# Patient Record
Sex: Male | Born: 1970 | Race: White | Hispanic: No | Marital: Married | Smoking: Never smoker
Health system: Southern US, Community
[De-identification: ages and names within clinical notes are randomized; demographics above are authoritative.]

## PROBLEM LIST (undated history)

## (undated) DIAGNOSIS — R0681 Apnea, not elsewhere classified: Secondary | ICD-10-CM

## (undated) DIAGNOSIS — E7800 Pure hypercholesterolemia, unspecified: Secondary | ICD-10-CM

---

## 2008-01-06 ENCOUNTER — Ambulatory Visit: Payer: Self-pay | Admitting: Internal Medicine

## 2008-01-18 ENCOUNTER — Emergency Department: Payer: Self-pay | Admitting: Emergency Medicine

## 2008-07-06 ENCOUNTER — Ambulatory Visit: Payer: Self-pay | Admitting: Internal Medicine

## 2008-07-23 ENCOUNTER — Ambulatory Visit: Payer: Self-pay | Admitting: Internal Medicine

## 2009-01-05 ENCOUNTER — Ambulatory Visit: Payer: Self-pay | Admitting: Internal Medicine

## 2011-10-08 ENCOUNTER — Ambulatory Visit: Payer: Self-pay | Admitting: Internal Medicine

## 2011-10-12 ENCOUNTER — Ambulatory Visit: Payer: Self-pay | Admitting: Internal Medicine

## 2011-10-17 ENCOUNTER — Ambulatory Visit: Payer: Self-pay | Admitting: Internal Medicine

## 2011-10-28 ENCOUNTER — Emergency Department: Payer: Self-pay | Admitting: Emergency Medicine

## 2012-01-05 ENCOUNTER — Emergency Department: Payer: Self-pay | Admitting: Emergency Medicine

## 2012-01-16 ENCOUNTER — Other Ambulatory Visit: Payer: Self-pay | Admitting: Internal Medicine

## 2012-01-16 LAB — LIPID PANEL
HDL Cholesterol: 42 mg/dL (ref 40–60)
VLDL Cholesterol, Calc: 42 mg/dL — ABNORMAL HIGH (ref 5–40)

## 2012-06-29 ENCOUNTER — Emergency Department: Payer: Self-pay | Admitting: Unknown Physician Specialty

## 2012-06-29 LAB — BASIC METABOLIC PANEL WITH GFR
Anion Gap: 6 — ABNORMAL LOW
BUN: 15 mg/dL
Calcium, Total: 9 mg/dL
Chloride: 105 mmol/L
Co2: 27 mmol/L
Creatinine: 1.05 mg/dL
EGFR (African American): >60
EGFR (Non-African Amer.): >60
Glucose: 109 mg/dL — ABNORMAL HIGH
Osmolality: 277
Potassium: 3.9 mmol/L
Sodium: 138 mmol/L

## 2012-06-29 LAB — HEPATIC FUNCTION PANEL A (ARMC)
Albumin: 3.9 g/dL
Alkaline Phosphatase: 61 U/L
Bilirubin, Direct: 0.1 mg/dL
Bilirubin,Total: 0.4 mg/dL
SGOT(AST): 24 U/L
SGPT (ALT): 35 U/L
Total Protein: 7.6 g/dL

## 2012-06-29 LAB — CBC
MCH: 27.9 pg (ref 26.0–34.0)
MCV: 83 fL (ref 80–100)
Platelet: 199 10*3/uL (ref 150–440)
WBC: 9.7 10*3/uL (ref 3.8–10.6)

## 2012-06-29 LAB — PRO B NATRIURETIC PEPTIDE: B-Type Natriuretic Peptide: 104 pg/mL

## 2012-06-29 LAB — TROPONIN I: Troponin-I: <0.02 ng/mL

## 2013-04-17 ENCOUNTER — Ambulatory Visit: Payer: Self-pay | Admitting: Gastroenterology

## 2013-12-23 IMAGING — CR DG CHEST 2V
1 series · 4 of 4 positions shown · non-contrast
Comparison: none

REASON FOR EXAM: cough, wheeze, fever
COMMENTS:

[Series 1: pa · 0.17mm/px · 4 of 4 slices shown]
[im 1/4]
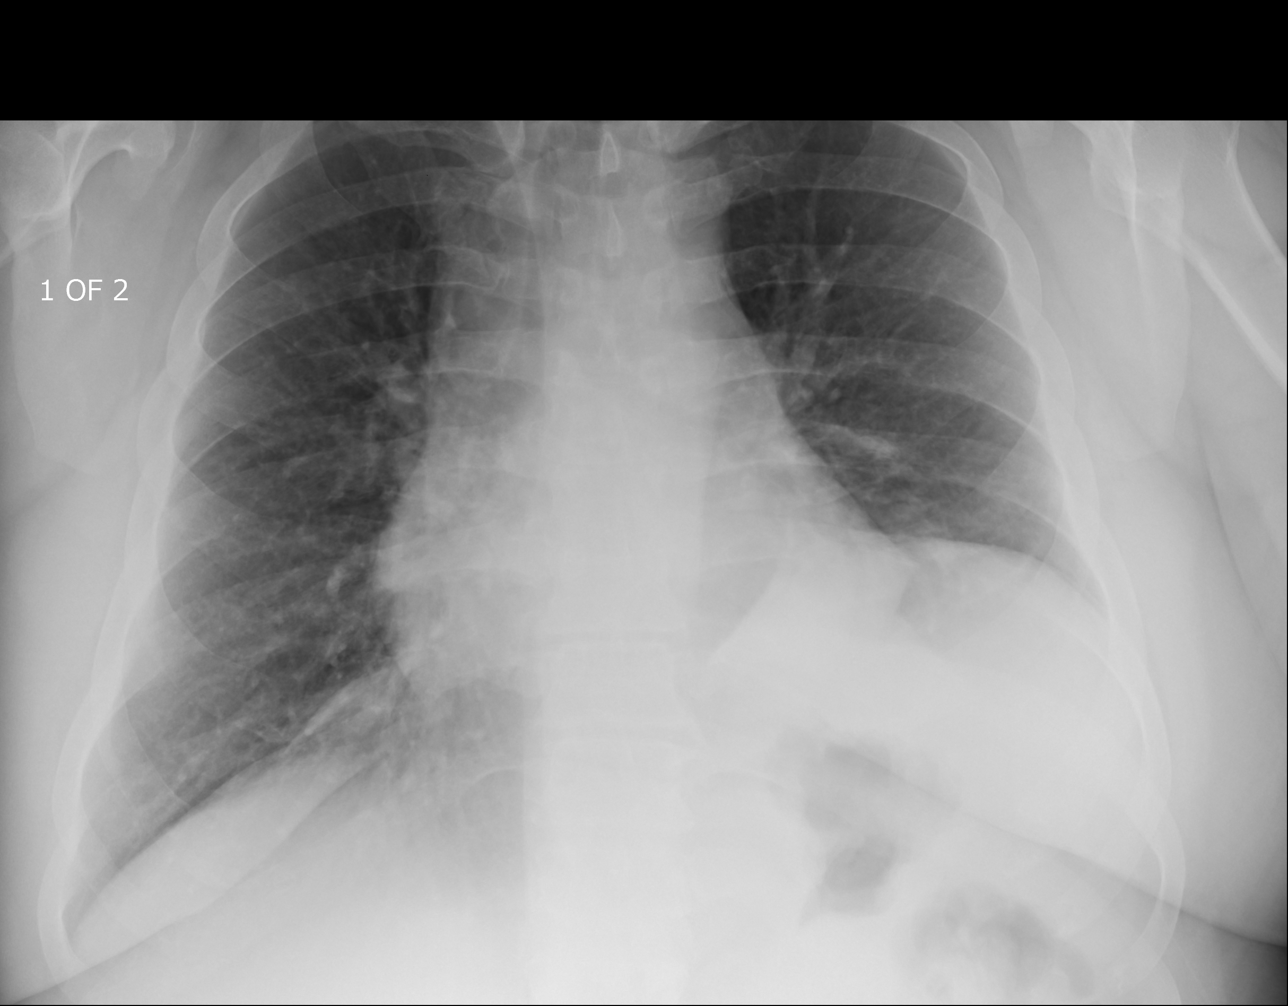
[im 2/4]
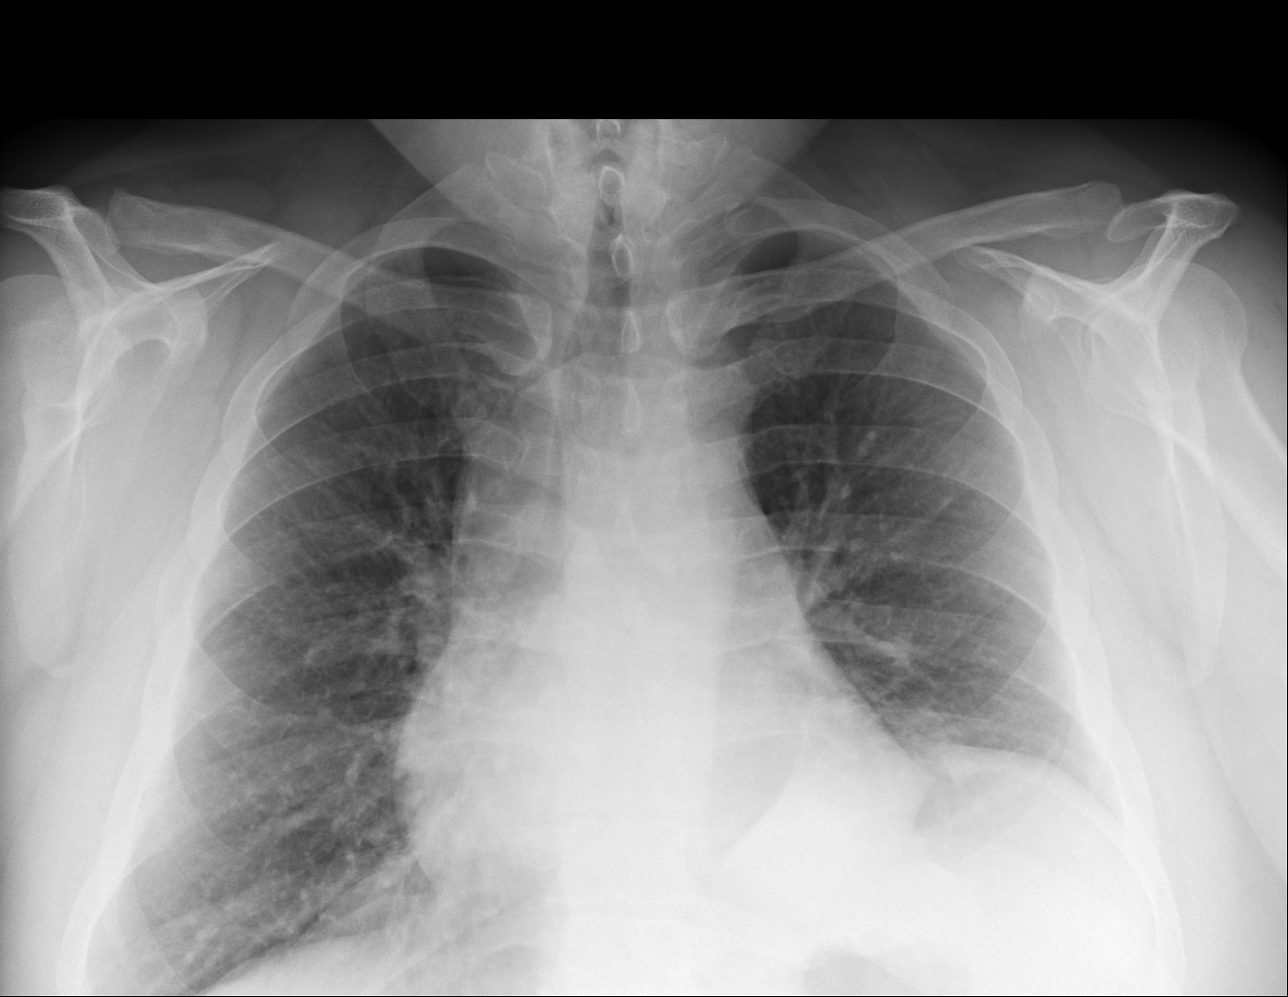
[im 3/4]
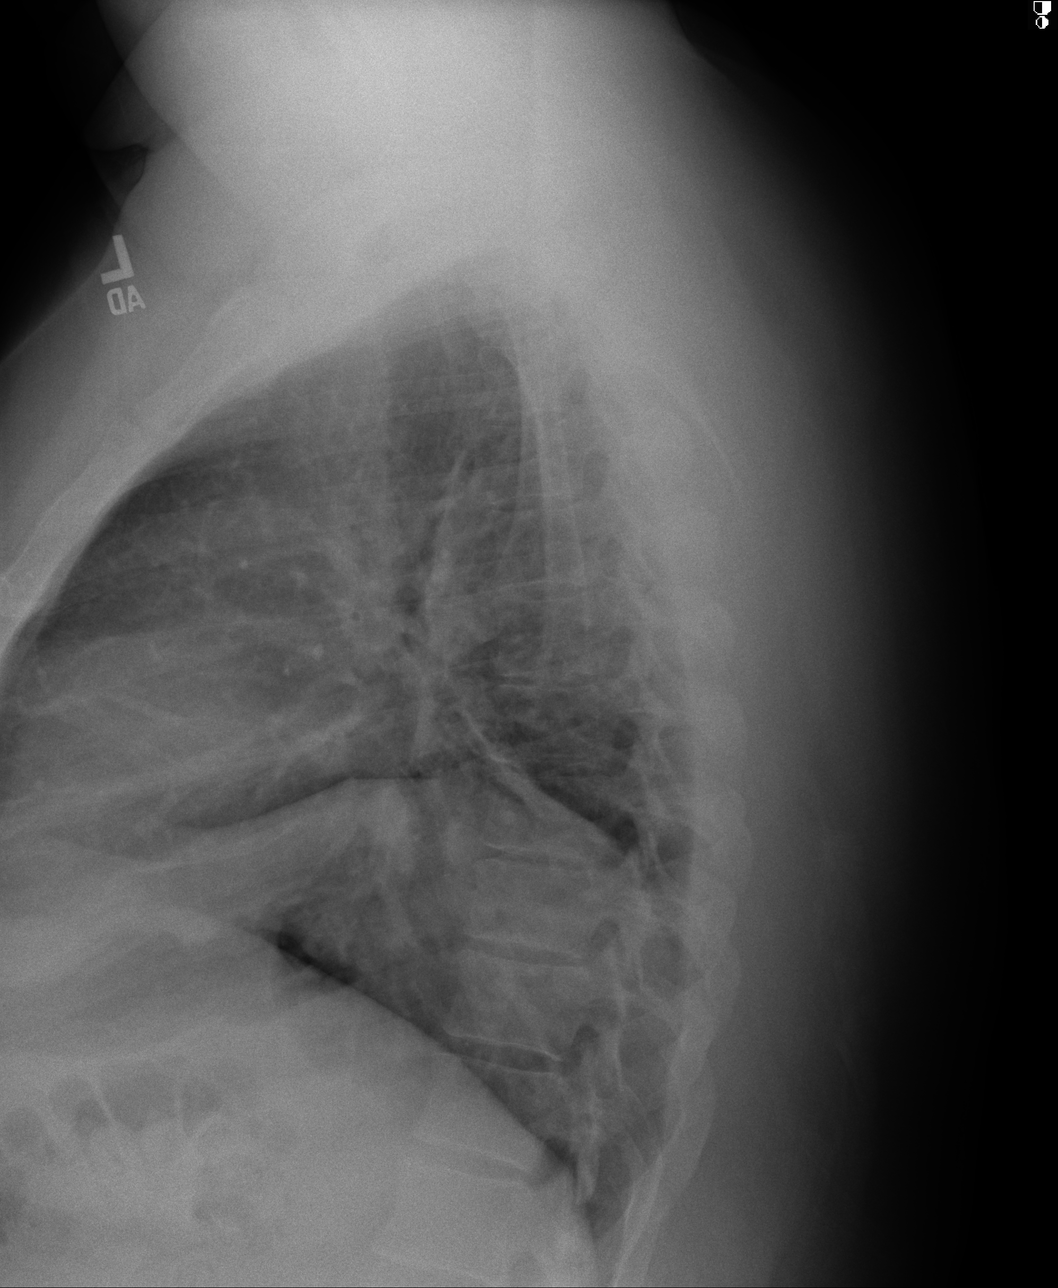
[im 4/4]
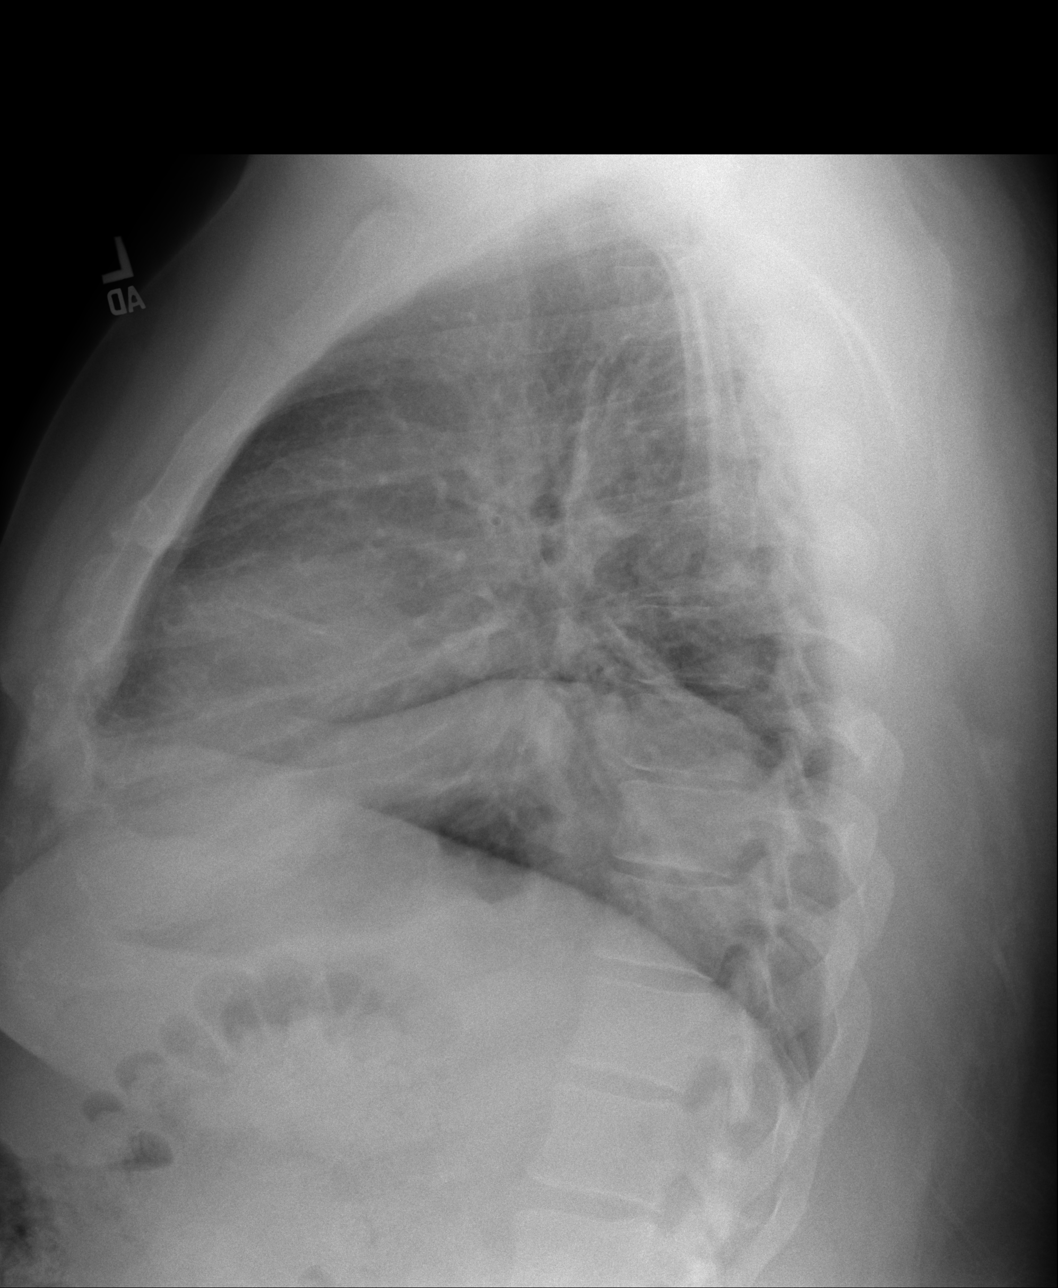

[4 of 4 positions shown; findings below may reference images not displayed]

PROCEDURE:     DXR - DXR CHEST PA (OR AP) AND LATERAL  - October 28, 2011 [DATE]

RESULT:     Comparison is made to a prior study dated 10/08/2011.

There is elevation of the left hemidiaphragm. Prominence of interstitial
markings is appreciated which is mild. No focal regions of consolidation are
identified. The visualized bony skeleton is unremarkable.
IMPRESSION: Elevated left hemidiaphragm without evidence of acute cardiopulmonary
disease.

## 2014-03-02 IMAGING — US US EXTREM LOW VENOUS*L*
1 series · 14 of 24 positions shown · non-contrast
Comparison: none

REASON FOR EXAM: Pain and increase edema left lower leg
COMMENTS:

[Series 1: us extrem low venous*left* · 0.12mm/px · 14 of 24 slices shown]
[im 1/24]
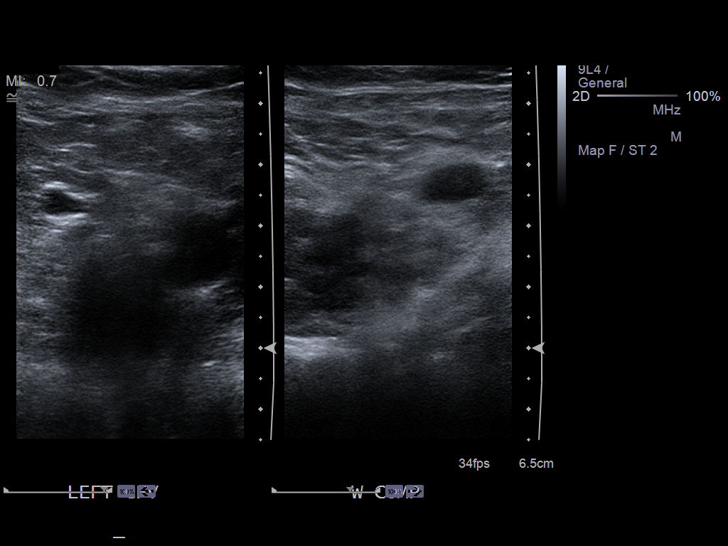
[im 3/24]
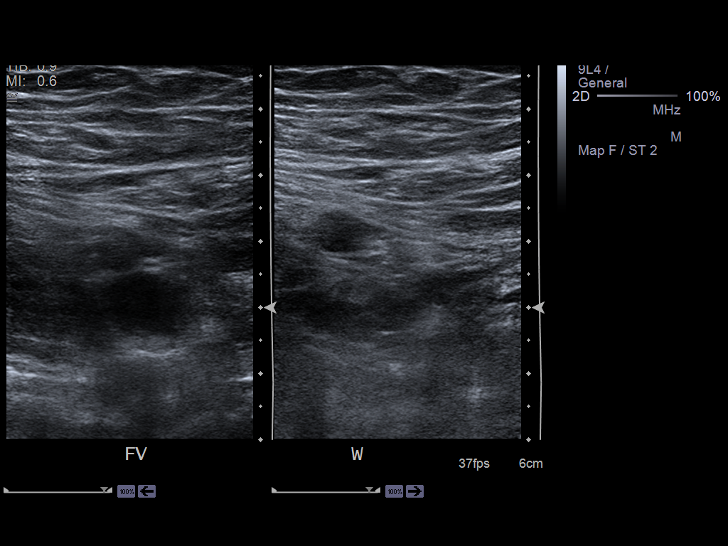
[im 5/24]
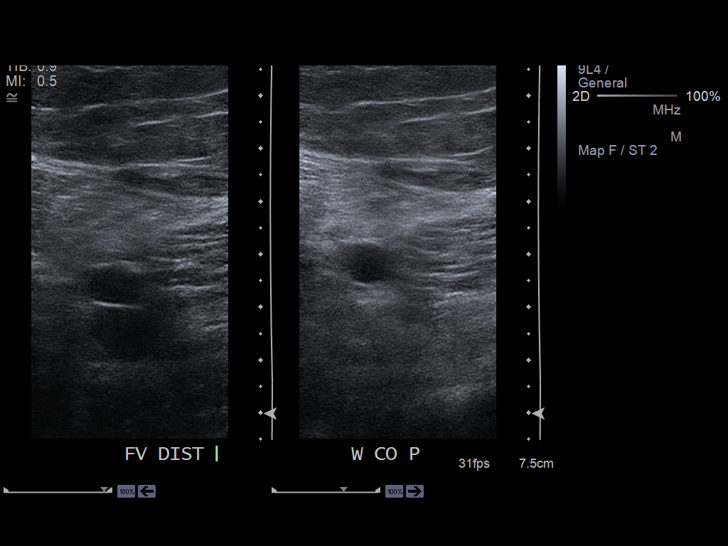
[im 7/24]
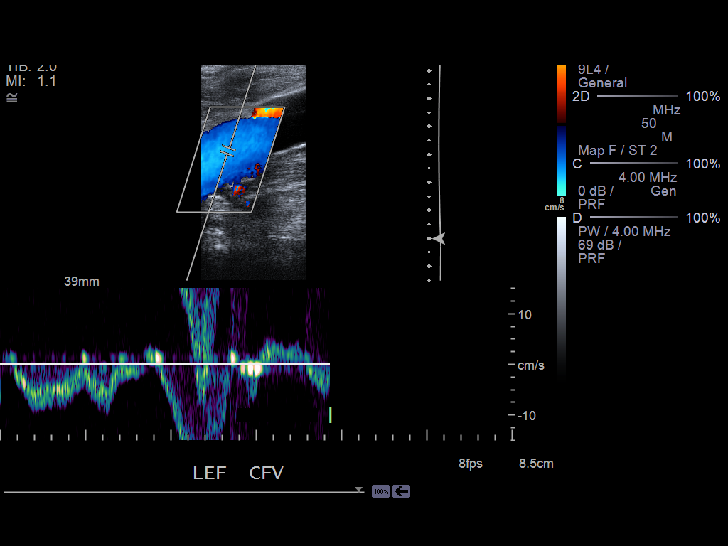
[im 8/24]
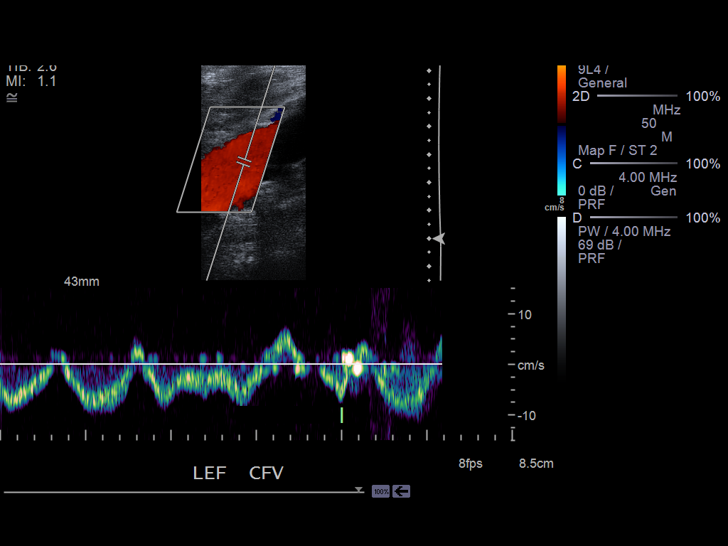
[im 10/24]
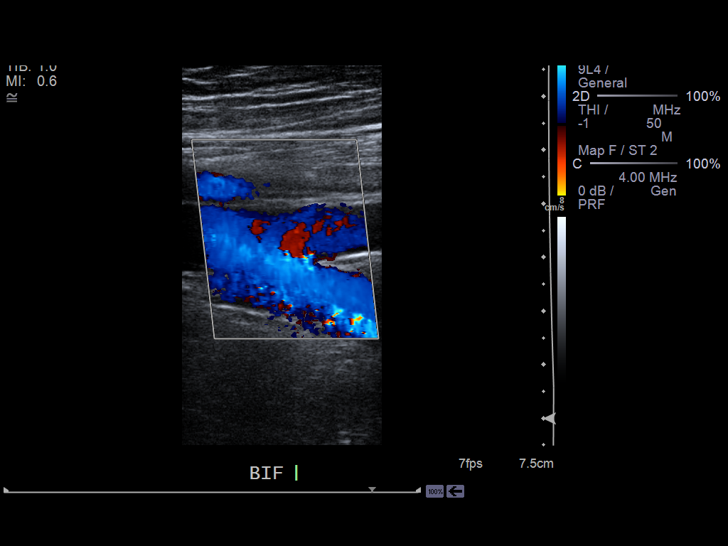
[im 12/24]
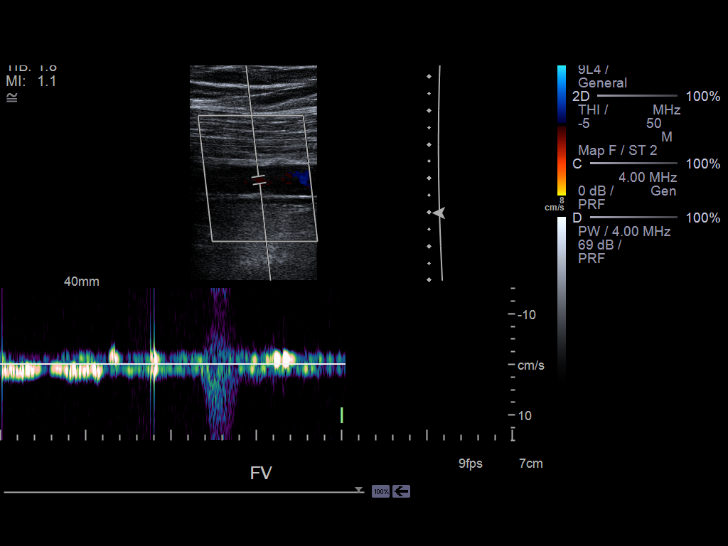
[im 13/24]
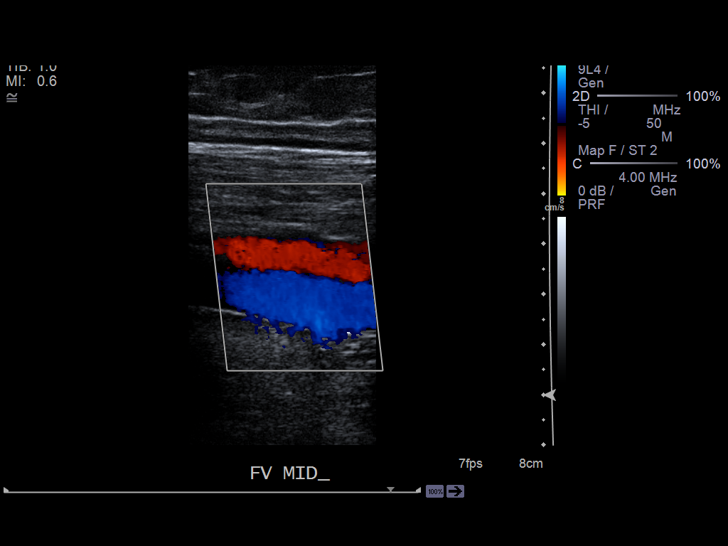
[im 15/24]
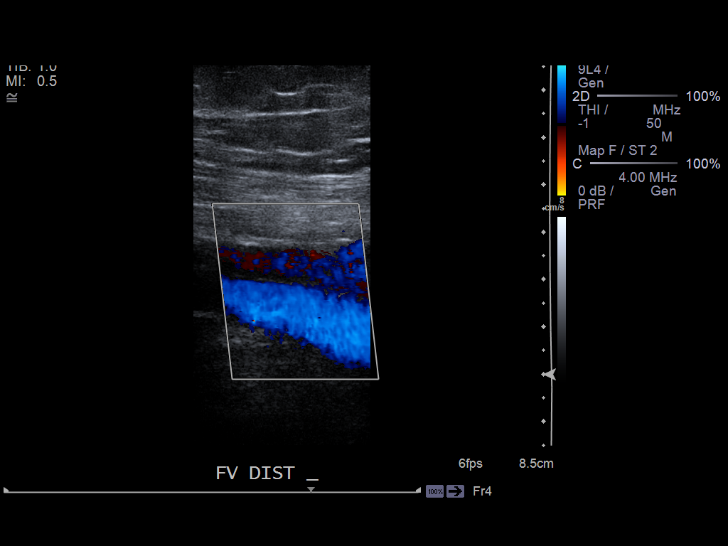
[im 17/24]
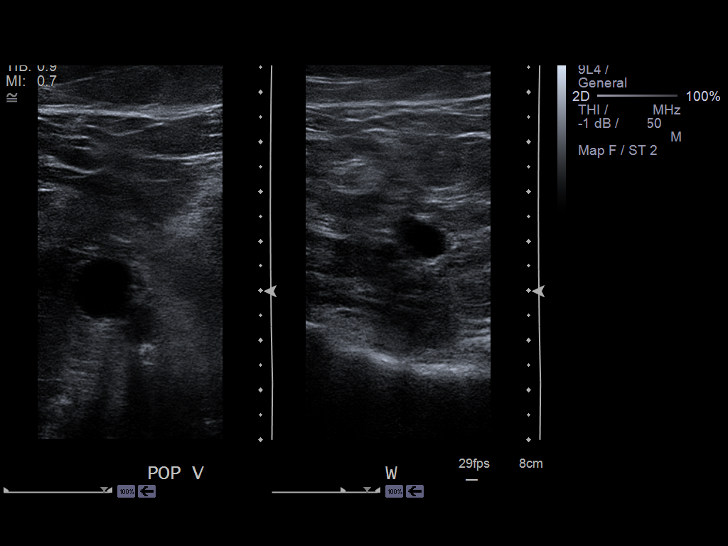
[im 19/24]
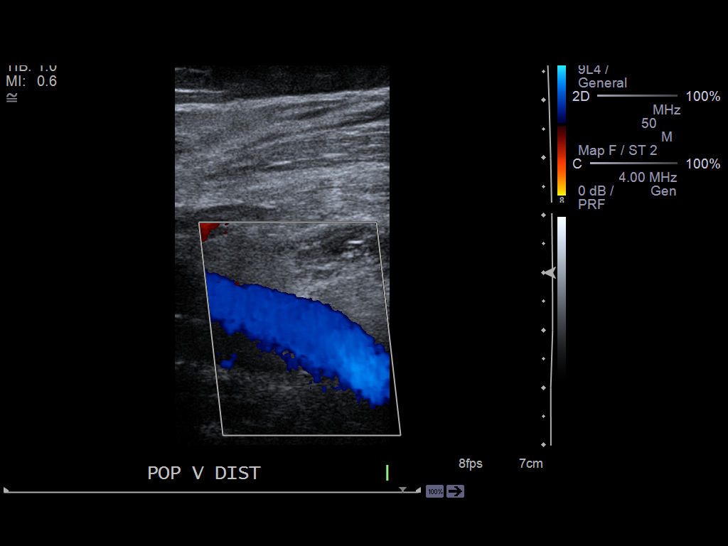
[im 20/24]
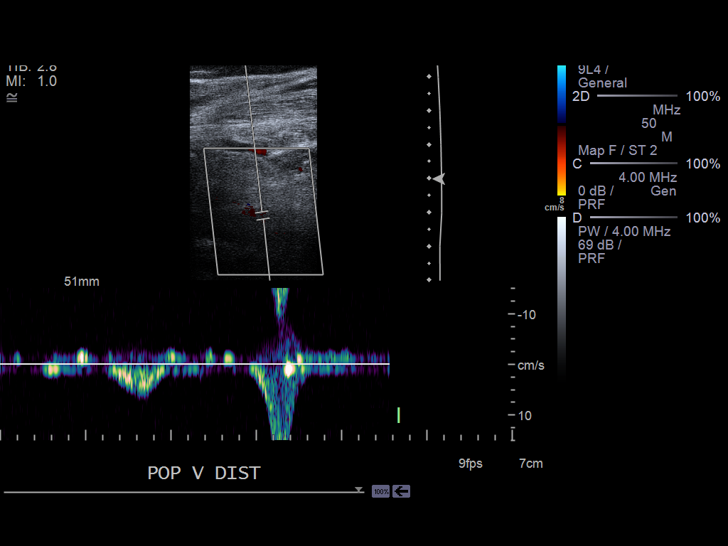
[im 22/24]
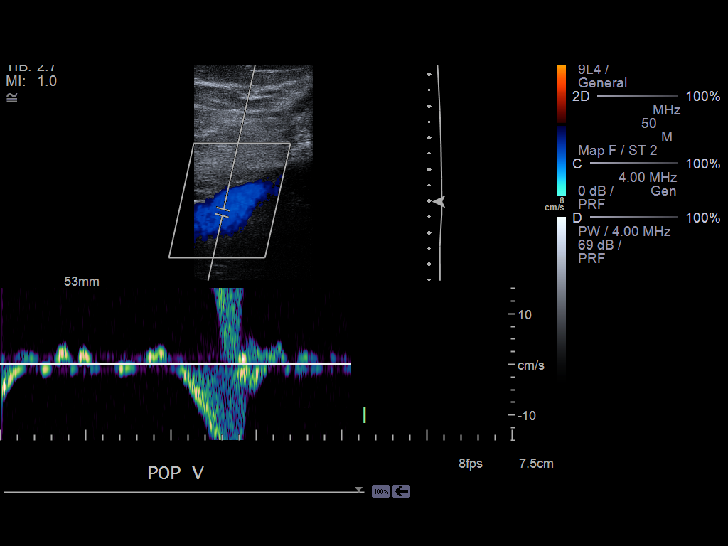
[im 24/24]
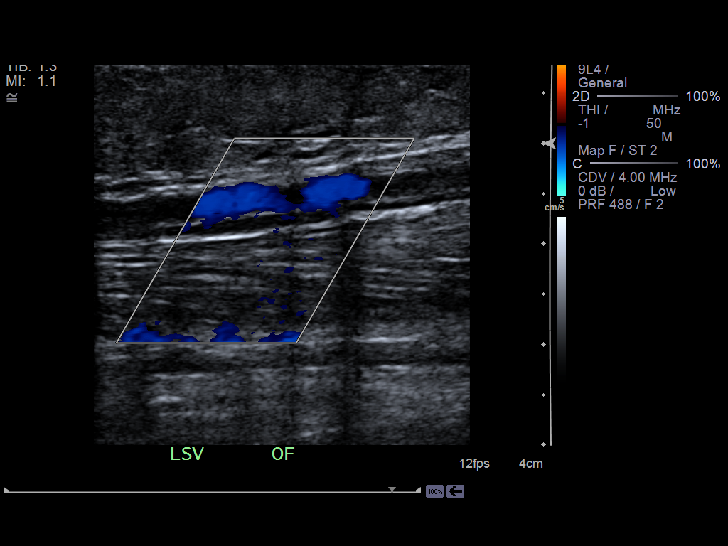

[14 of 24 positions shown; findings below may reference images not displayed]

PROCEDURE:     US  - US DOPPLER LOW EXTR LEFT  - January 05, 2012 [DATE]

RESULT:     Technique: Gray scale, Duplex color flow and SPECTRAL waveform
imaging was performed of the deep venous structures of the LEFT lower
extremity.

There is not evidence of increased echogenicity, non- compressibility,
abnormal waveform or abnormal grayscale flow with the interrogated deep
venous structures of the LEFT lower extremity. There is appropriate response
to Valsalva and augmentation within the interrogated vessels.
IMPRESSION: 1. No sonographic evidence of a deep venous thrombus within the interrogated
vessels of the LEFT lower extremity.

## 2014-08-25 IMAGING — CR DG CHEST 2V
1 series · 2 of 2 positions shown · non-contrast
Comparison: none

REASON FOR EXAM: SOB
COMMENTS:

PROCEDURE:     DXR - DXR CHEST PA (OR AP) AND LATERAL  - June 29, 2012  [DATE]
RESULT:     Comparison: None

[Series 1: w chest pa · 0.14mm/px · 2 of 2 slices shown]
[im 1/2]
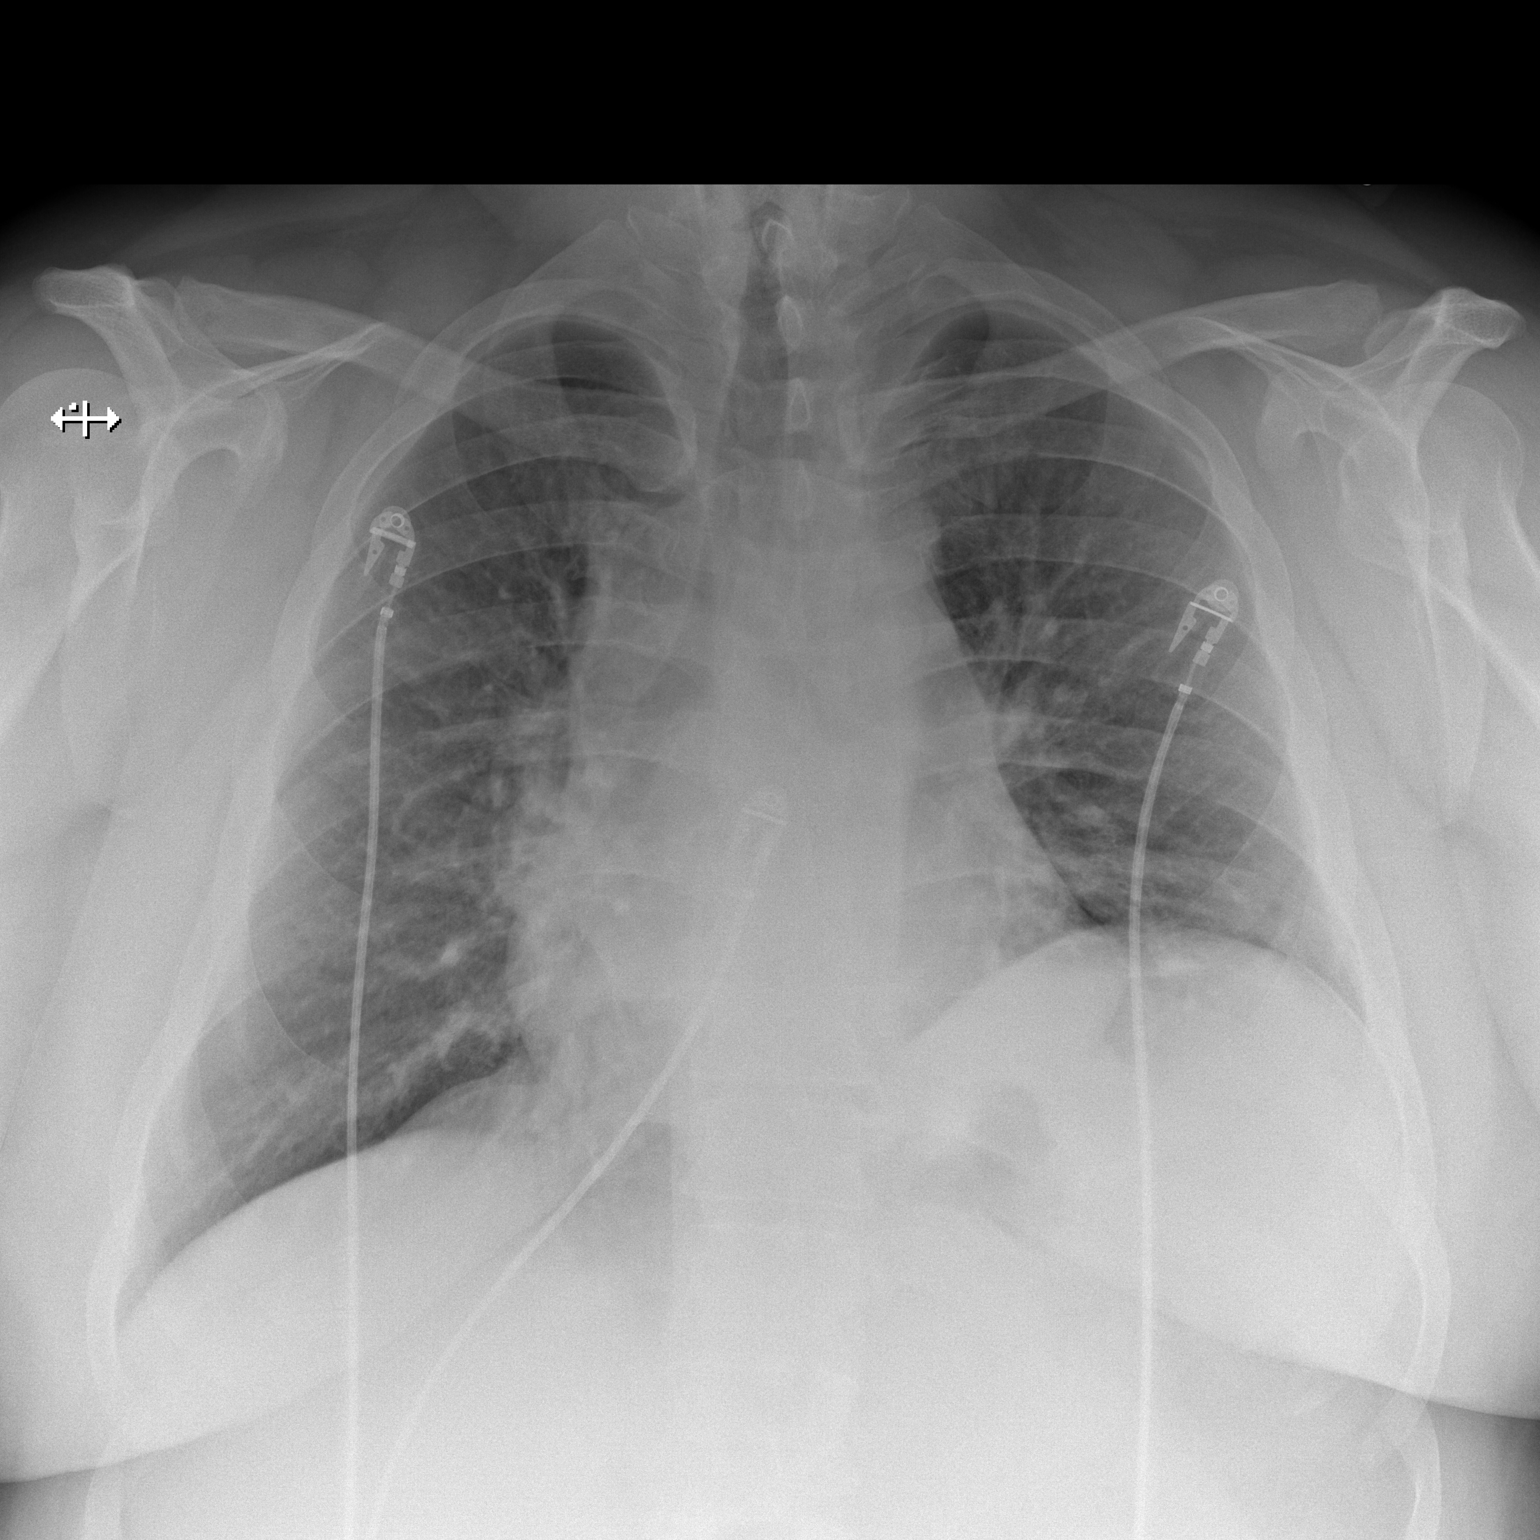
[im 2/2]
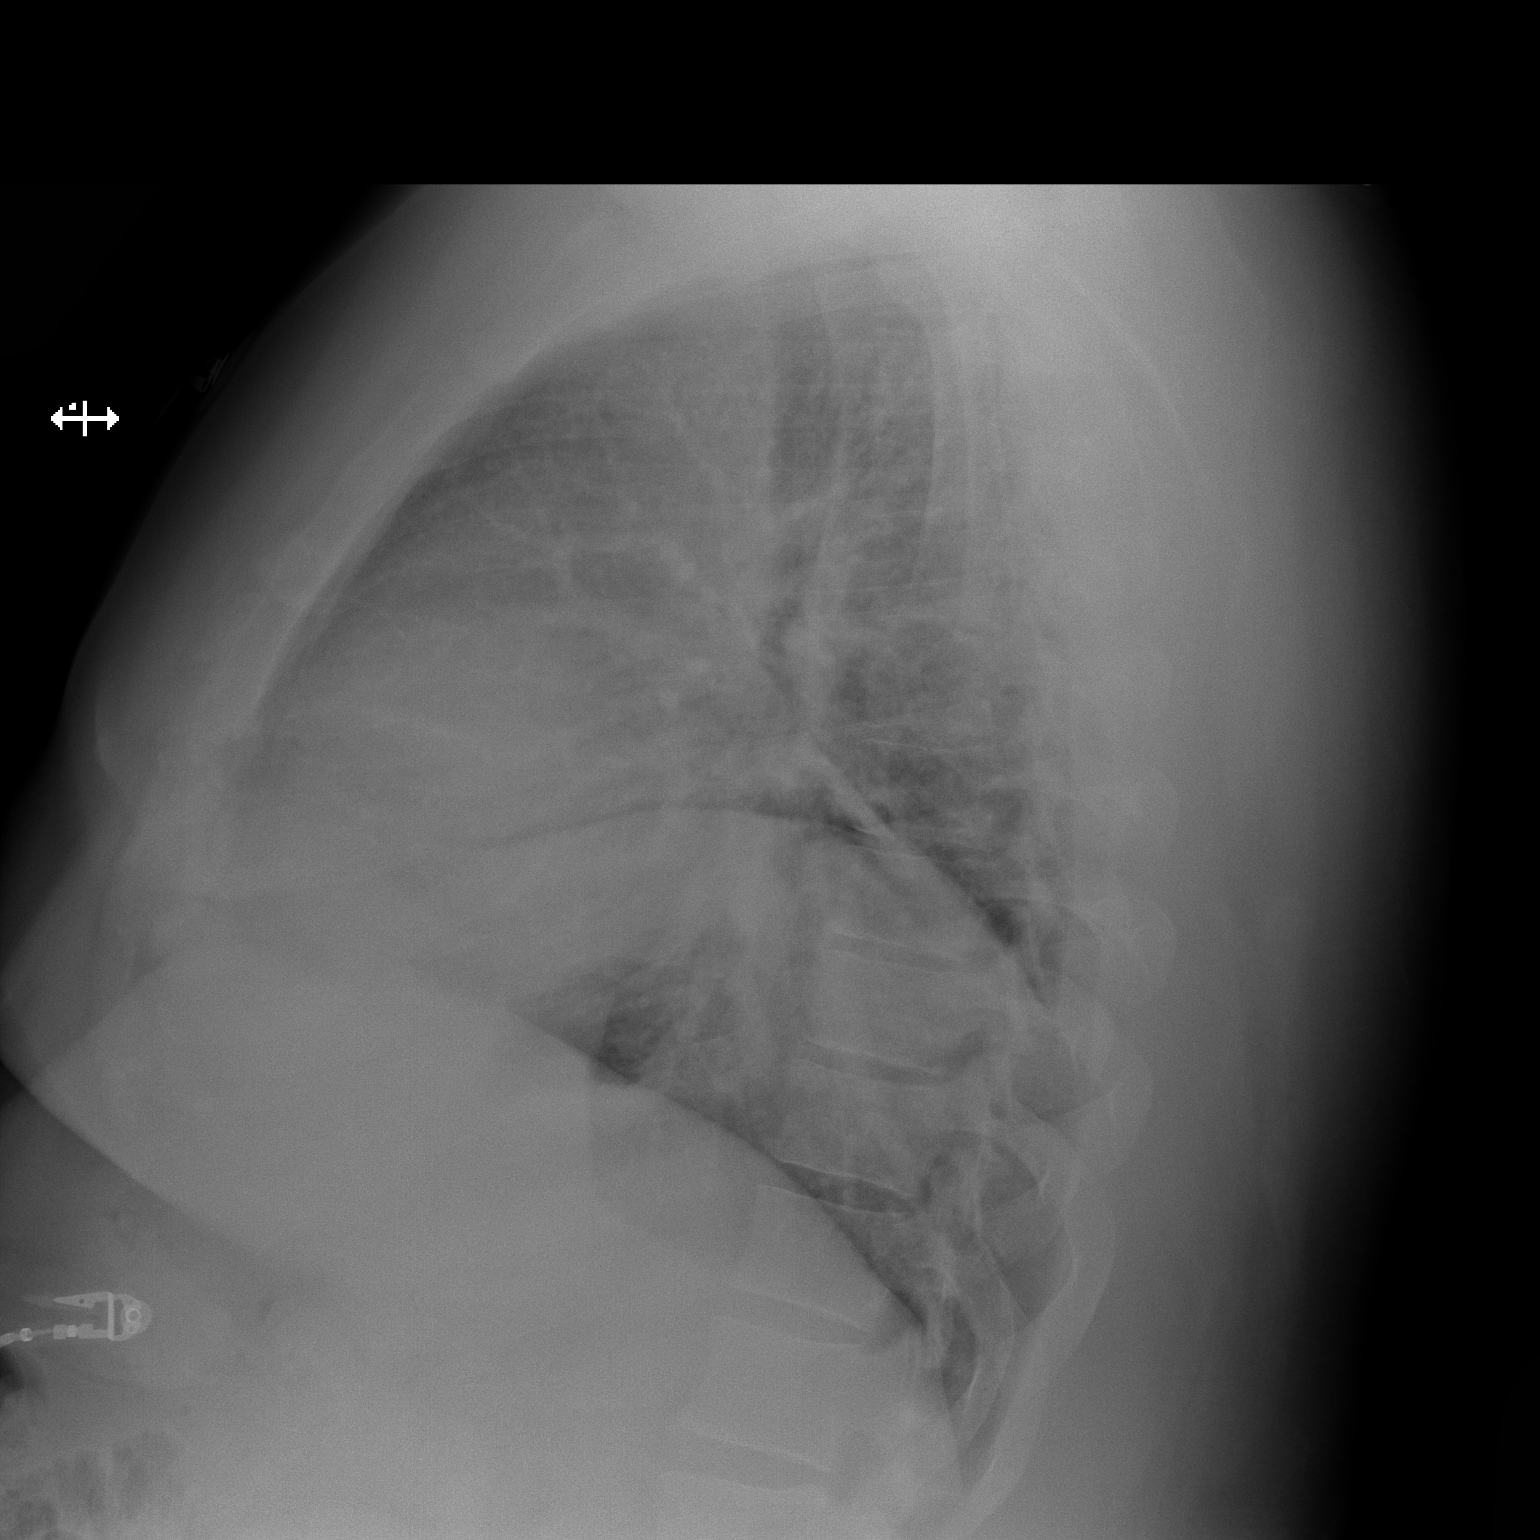

[2 of 2 positions shown; findings below may reference images not displayed]

FINDINGS: PA and lateral chest radiographs are provided. There is elevation of the
left diaphragm. There is bilateral diffuse interstitial thickening likely
representing interstitial edema versus interstitial pneumonitis secondary to
an infectious or inflammatory etiology. There is no focal parenchymal
opacity, pleural effusion, or pneumothorax. The heart and mediastinum are
unremarkable.  The osseous structures are unremarkable.
IMPRESSION: There is bilateral diffuse interstitial thickening likely representing
interstitial edema versus interstitial pneumonitis secondary to an
infectious or inflammatory etiology.

[REDACTED]

## 2016-09-14 ENCOUNTER — Emergency Department: Payer: Managed Care, Other (non HMO)

## 2016-09-14 ENCOUNTER — Emergency Department
Admission: EM | Admit: 2016-09-14 | Discharge: 2016-09-14 | Disposition: A | Discharge status: Home / Self Care | Payer: Managed Care, Other (non HMO) | Attending: Emergency Medicine | Admitting: Emergency Medicine

## 2016-09-14 DIAGNOSIS — R062 Wheezing: Secondary | ICD-10-CM

## 2016-09-14 DIAGNOSIS — J181 Lobar pneumonia, unspecified organism: Secondary | ICD-10-CM | POA: Insufficient documentation

## 2016-09-14 DIAGNOSIS — J101 Influenza due to other identified influenza virus with other respiratory manifestations: Secondary | ICD-10-CM | POA: Diagnosis not present

## 2016-09-14 DIAGNOSIS — J189 Pneumonia, unspecified organism: Secondary | ICD-10-CM

## 2016-09-14 DIAGNOSIS — R05 Cough: Secondary | ICD-10-CM | POA: Diagnosis present

## 2016-09-14 HISTORY — DX: Apnea, not elsewhere classified: R06.81

## 2016-09-14 HISTORY — DX: Pure hypercholesterolemia, unspecified: E78.00

## 2016-09-14 LAB — INFLUENZA PANEL BY PCR (TYPE A & B)
INFLAPCR: NEGATIVE
Influenza B By PCR: POSITIVE — AB

## 2016-09-14 MED ORDER — ALBUTEROL SULFATE HFA 108 (90 BASE) MCG/ACT IN AERS: 2.0000 | INHALATION_SPRAY | RESPIRATORY_TRACT | 0 refills | Status: AC | PRN

## 2016-09-14 MED ORDER — OSELTAMIVIR PHOSPHATE 75 MG PO CAPS
75.0000 mg | ORAL_CAPSULE | Freq: Two times a day (BID) | ORAL | 0 refills | Status: AC
Start: 2016-09-14 — End: ?

## 2016-09-14 MED ORDER — HYDROCOD POLST-CPM POLST ER 10-8 MG/5ML PO SUER: 5.0000 mL | Freq: Two times a day (BID) | ORAL | 0 refills | Status: AC

## 2016-09-14 MED ORDER — MAGIC MOUTHWASH: 5.0000 mL | Freq: Three times a day (TID) | ORAL | 0 refills | Status: AC | PRN

## 2016-09-14 MED ORDER — MAGIC MOUTHWASH
10.0000 mL | Freq: Once | ORAL | Status: AC
  Administered 2016-09-14: 10 mL via ORAL
  Filled 2016-09-14: qty 10

## 2016-09-14 MED ORDER — PREDNISONE 20 MG PO TABS
60.0000 mg | ORAL_TABLET | Freq: Once | ORAL | Status: AC
  Administered 2016-09-14: 60 mg via ORAL
  Filled 2016-09-14: qty 3

## 2016-09-14 MED ORDER — AZITHROMYCIN 250 MG PO TABS: 250.0000 mg | ORAL_TABLET | Freq: Every day | ORAL | 0 refills | Status: AC

## 2016-09-14 MED ORDER — AZITHROMYCIN 500 MG PO TABS
500.0000 mg | ORAL_TABLET | Freq: Once | ORAL | Status: AC
  Administered 2016-09-14: 500 mg via ORAL
  Filled 2016-09-14: qty 1

## 2016-09-14 NOTE — ED Triage Notes (Signed)
Patient ambulatory to triage with steady gait, without difficulty or distress noted, mask in place; pt reports cough, congestion, yellow sputum, fever

## 2016-09-14 NOTE — Discharge Instructions (Addendum)
1. You may start Tamiflu as prescribed. 2. Finish prednisone 60 mg daily 4 days. Start your next dose on Saturday. 3. You may use albuterol inhaler 2 puffs every 4 hours as needed for wheezing. 4. Finish antibiotic as prescribed (azithromycin 250 mg daily 4 days). Start your next dose on Saturday. 4. Return to the ER for worsening symptoms, persistent vomiting, difficulty breathing or other concerns.

## 2016-09-14 NOTE — ED Provider Notes (Signed)
[facility] Emergency Department Provider Note   ____________________________________________   First MD Initiated Contact with Patient 09/14/16 [id number]     (approximate)  I have reviewed the triage vital signs and the nursing notes.   HISTORY  Chief Complaint Cough and Nasal Congestion    HPI Andrew Gallagher is a 46 y.o. male who presents to the ED from home with a chief complaint of flulike symptoms. Patient reports a 2 day history of myalgias, low-grade fevers, cough productive of yellow sputum, nasal congestion, wheezing. Denies associated chest pain, shortness of breath, abdominal pain, nausea, vomiting, diarrhea. Denies travel or trauma. Nothing makes his symptoms better or worse.   Past Medical History:  Diagnosis Date  . Apnea   . Hypercholesteremia     There are no active problems to display for this patient.   History reviewed. No pertinent surgical history.  Prior to Admission medications   Medication Sig Start Date End Date Taking? Authorizing Provider  albuterol (PROVENTIL HFA;VENTOLIN HFA) 108 (90 Base) MCG/ACT inhaler Inhale 2 puffs into the lungs every 4 (four) hours as needed for wheezing or shortness of breath. 09/14/16   Irean HongJade J Sung, MD  azithromycin (ZITHROMAX) 250 MG tablet Take 1 tablet (250 mg total) by mouth daily. 09/14/16   Irean HongJade J Sung, MD  chlorpheniramine-HYDROcodone (TUSSIONEX PENNKINETIC ER) 10-8 MG/5ML SUER Take 5 mLs by mouth 2 (two) times daily. 09/14/16   Irean HongJade J Sung, MD  magic mouthwash SOLN Take 5 mLs by mouth 3 (three) times daily as needed for mouth pain. 09/14/16   Irean HongJade J Sung, MD  oseltamivir (TAMIFLU) 75 MG capsule Take 1 capsule (75 mg total) by mouth 2 (two) times daily. 09/14/16   Irean HongJade J Sung, MD    Allergies Patient has no known allergies.  No family history on file.  Social History Social History  Substance Use Topics  . Smoking status: Never Smoker  . Smokeless tobacco: Never Used  . Alcohol use No     Review of Systems  Constitutional: Positive for fever/chills. As her for myalgias. Eyes: No visual changes. ENT: Positive for sore throat. Cardiovascular: Denies chest pain. Respiratory: Positive for productive cough and wheezing.Denies shortness of breath. Gastrointestinal: No abdominal pain.  No nausea, no vomiting.  No diarrhea.  No constipation. Genitourinary: Negative for dysuria. Musculoskeletal: Negative for back pain. Skin: Negative for rash. Neurological: Negative for headaches, focal weakness or numbness.  10-point ROS otherwise negative.  ____________________________________________   PHYSICAL EXAM:  VITAL SIGNS: ED Triage Vitals [09/14/16 0450]  Enc Vitals Group     BP (!) 164/83     Pulse Rate 85     Resp (!) 22     Temp 99 F (37.2 C)     Temp Source Oral     SpO2 96 %     Weight (!) 382 lb (173.3 kg)     Height 5\' 9"  (1.753 m)     Head Circumference      Peak Flow      Pain Score 6     Pain Loc      Pain Edu?      Excl. in GC?      Constitutional: Alert and oriented. Well appearing and in mild acute distress. Eyes: Conjunctivae are normal. PERRL. EOMI. Head: Atraumatic. Ears: Bilateral TM dullness. Nose: Congestion/rhinnorhea. Mouth/Throat: Mucous membranes are moist.  Oropharynx mildly erythematous without tonsillar swelling, exudates or peritonsillar abscess. No hoarse or muffled voice. There is no drooling. Neck:  No stridor.  Supple neck without meningismus. Hematological/Lymphatic/Immunilogical: Shotty anterior cervical lymphadenopathy. Cardiovascular: Normal rate, regular rhythm. Grossly normal heart sounds.  Good peripheral circulation. Respiratory: Normal respiratory effort.  No retractions. Lungs initially with scant wheezing which cleared with deep breathing. Gastrointestinal: Soft and nontender. No distention. No abdominal bruits. No CVA tenderness. Musculoskeletal: No lower extremity tenderness nor edema.  No joint  effusions. Neurologic:  Normal speech and language. No gross focal neurologic deficits are appreciated. No gait instability. Skin:  Skin is warm, dry and intact. No rash noted. No petechiae. Psychiatric: Mood and affect are normal. Speech and behavior are normal.  ____________________________________________   LABS (all labs ordered are listed, but only abnormal results are displayed)  Labs Reviewed  INFLUENZA PANEL BY PCR (TYPE A & B) - Abnormal; Notable for the following:       Result Value   Influenza B By PCR POSITIVE (*)    All other components within normal limits   ____________________________________________  EKG  None ____________________________________________  RADIOLOGY  Chest 2 view (view by me, interpreted per Dr. Register): 1. Mild right base infiltrate.    2. Stable cardiomegaly.   ____________________________________________   PROCEDURES  Procedure(s) performed: None  Procedures  Critical Care performed: No  ____________________________________________   INITIAL IMPRESSION / ASSESSMENT AND PLAN / ED COURSE  Pertinent labs & imaging results that were available during my care of the patient were reviewed by me and considered in my medical decision making (see chart for details).  46 year old male presenting with flulike symptoms. He is positive for influenza B. Will obtain chest x-ray given wheezing heard on exam. Will initiate prednisone, Tamiflu; antibiotic is needed depending on chest x-ray.  Clinical Course as of Sep 14 810  Fri Sep 14, 2016  1610 Updated patient on chest xray results. Will place patient on azithromycin. Strict return precautions given. Patient verbalizes understanding and agrees with plan of care.  [JS]    Clinical Course User Index [JS] Irean Hong, MD     ____________________________________________   FINAL CLINICAL IMPRESSION(S) / ED DIAGNOSES  Final diagnoses:  Influenza B  Wheezing  Community acquired  pneumonia of right lower lobe of lung (HCC)      NEW MEDICATIONS STARTED DURING THIS VISIT:  Discharge Medication List as of 09/14/2016  7:41 AM    START taking these medications   Details  albuterol (PROVENTIL HFA;VENTOLIN HFA) 108 (90 Base) MCG/ACT inhaler Inhale 2 puffs into the lungs every 4 (four) hours as needed for wheezing or shortness of breath., Starting Fri 09/14/2016, Print    azithromycin (ZITHROMAX) 250 MG tablet Take 1 tablet (250 mg total) by mouth daily., Starting Fri 09/14/2016, Print    chlorpheniramine-HYDROcodone (TUSSIONEX PENNKINETIC ER) 10-8 MG/5ML SUER Take 5 mLs by mouth 2 (two) times daily., Starting Fri 09/14/2016, Print    magic mouthwash SOLN Take 5 mLs by mouth 3 (three) times daily as needed for mouth pain., Starting Fri 09/14/2016, Print    oseltamivir (TAMIFLU) 75 MG capsule Take 1 capsule (75 mg total) by mouth 2 (two) times daily., Starting Fri 09/14/2016, Print         Note:  This document was prepared using Dragon voice recognition software and may include unintentional dictation errors.    Irean Hong, MD 09/14/16 [phone]

## 2018-11-10 IMAGING — CR DG CHEST 2V
1 series · 2 of 2 positions shown · non-contrast
Comparison: 06/29/2012.

CLINICAL DATA: Low-grade fever.

EXAM:
CHEST  2 VIEW

[Series 1: dg chest 2 view · 0.14mm/px · 2 of 2 slices shown]
[im 1/2]
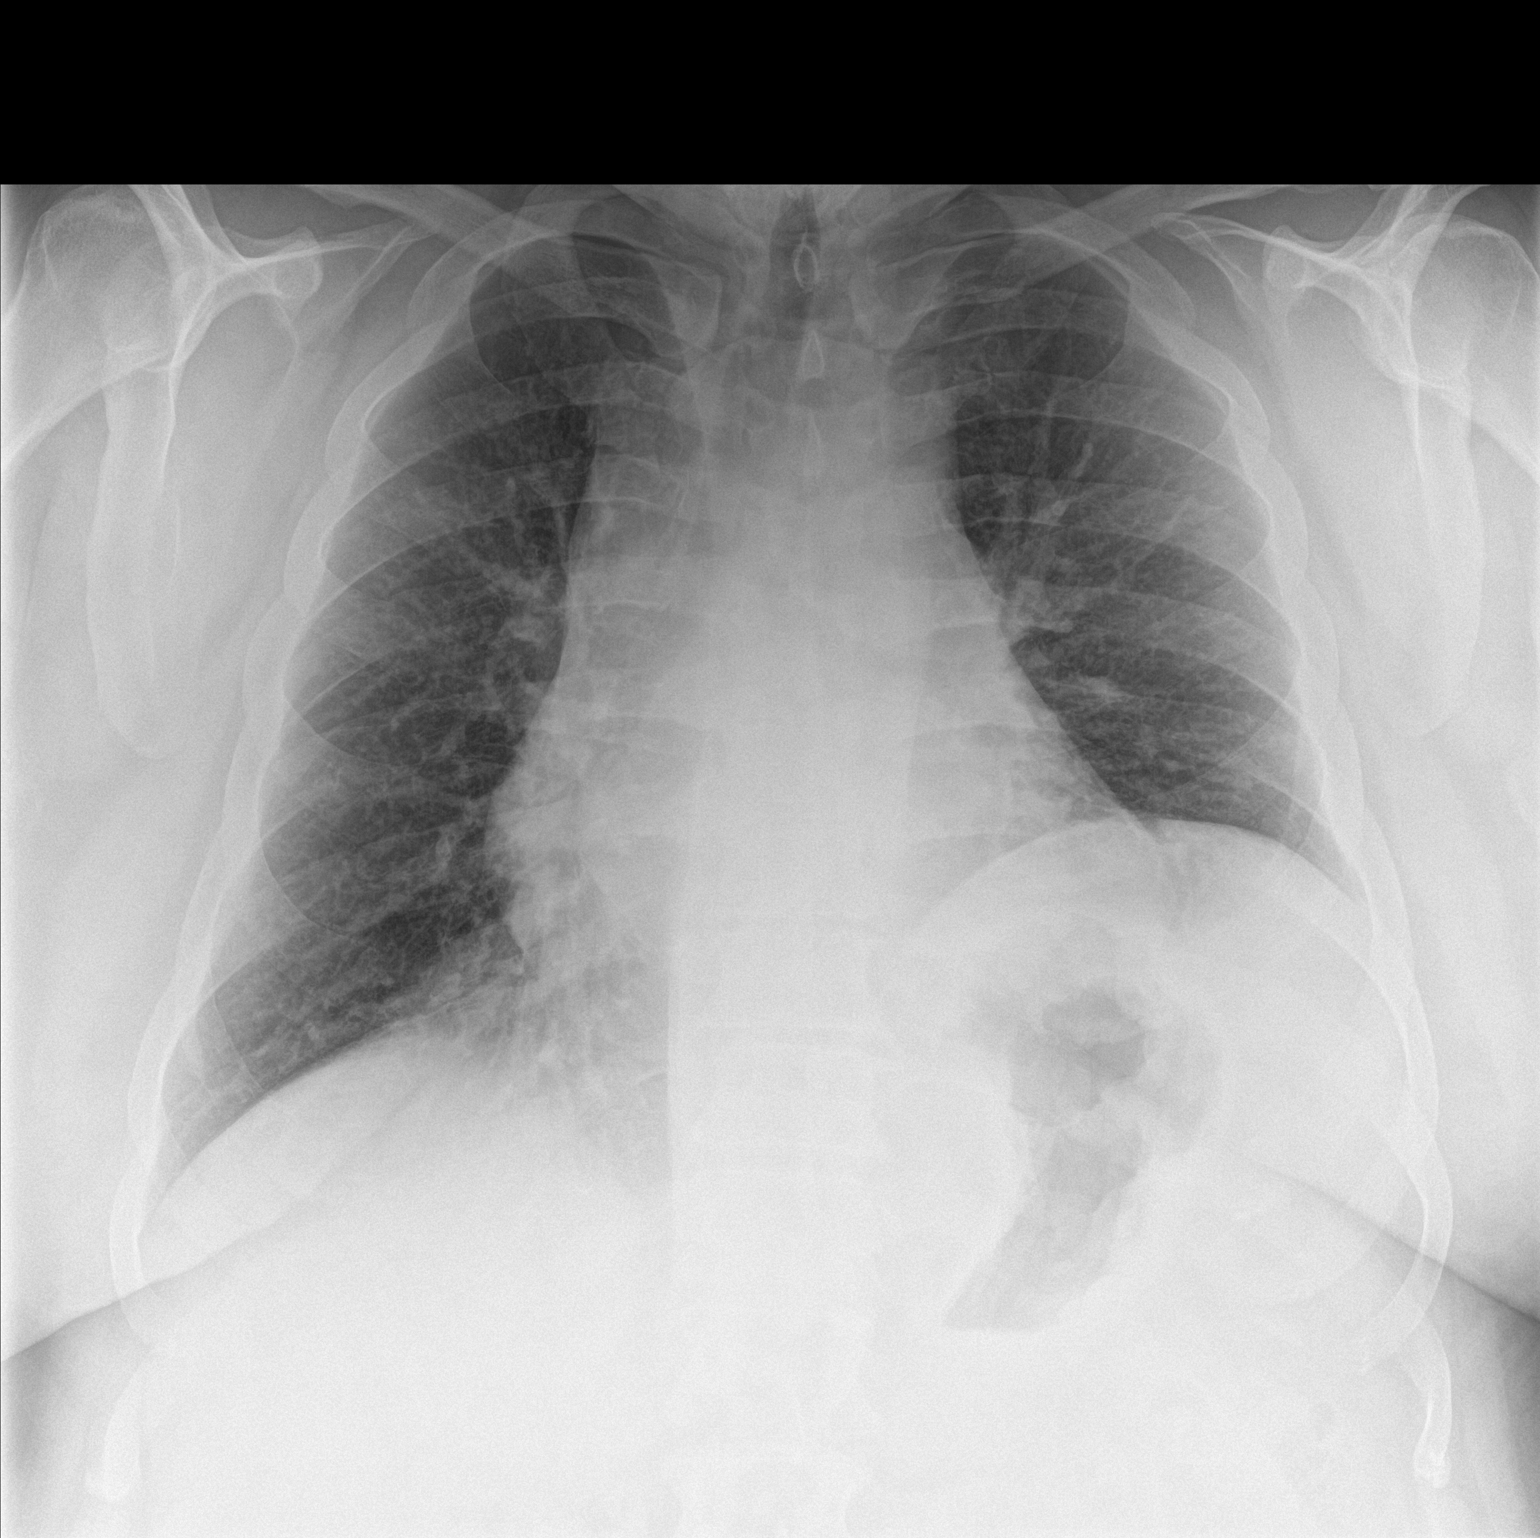
[im 2/2]
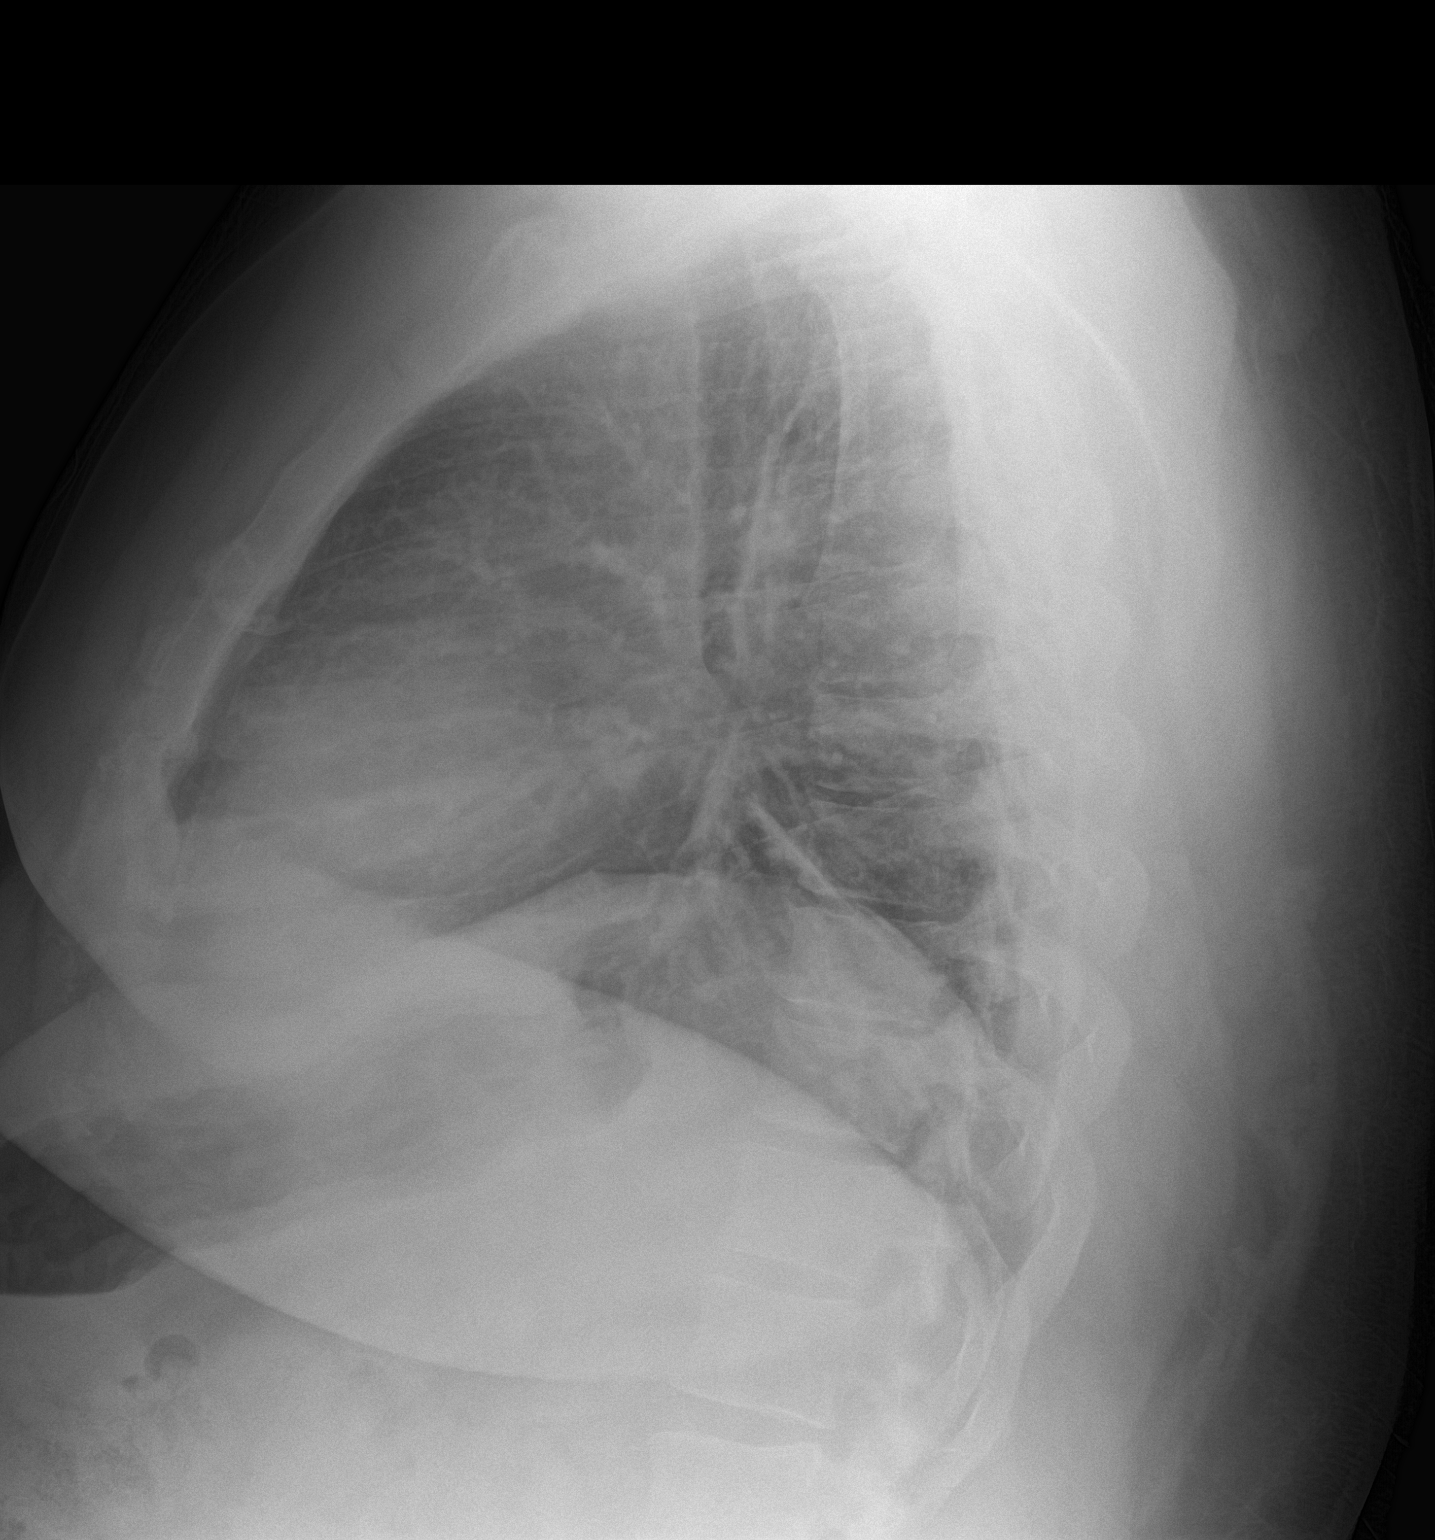

[2 of 2 positions shown; findings below may reference images not displayed]

FINDINGS: Cardiomegaly with normal pulmonary vascularity. Mild right base
infiltrate. Stable elevation left hemidiaphragm. No prominent
pleural effusion or pneumothorax.
IMPRESSION: 1. Mild right base infiltrate.

2.  Stable cardiomegaly.

## 2020-03-21 ENCOUNTER — Ambulatory Visit: Payer: Self-pay | Attending: Critical Care Medicine

## 2020-03-21 DIAGNOSIS — Z23 Encounter for immunization: Secondary | ICD-10-CM

## 2020-03-21 NOTE — Progress Notes (Signed)
   Covid-19 Vaccination Clinic  Name:  Andrew Gallagher    MRN: [id number] DOB: 12-16-1970  03/21/2020  Andrew Gallagher was observed post Covid-19 immunization for 15 minutes without incident. He was provided with Vaccine Information Sheet and instruction to access the V-Safe system.   Andrew Gallagher was instructed to call 911 with any severe reactions post vaccine: Marland Kitchen Difficulty breathing  . Swelling of face and throat  . A fast heartbeat  . A bad rash all over body  . Dizziness and weakness   Immunizations Administered    Name Date Dose VIS Date Route   Pfizer COVID-19 Vaccine 03/21/2020  4:41 PM 0.3 mL 10/07/2018 Intramuscular   Manufacturer: [employer], [employer]   Lot: [id number]   NDC: [address]-4

## 2020-04-11 ENCOUNTER — Ambulatory Visit: Payer: Self-pay
